# Patient Record
Sex: Male | Born: 2000 | Hispanic: No | Marital: Single | Smoking: Never smoker
Health system: Southern US, Community
[De-identification: ages and names within clinical notes are randomized; demographics above are authoritative.]

## PROBLEM LIST (undated history)

## (undated) HISTORY — PX: ABDOMINAL SURGERY: SHX537

---
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
## 2021-04-15 ENCOUNTER — Emergency Department (HOSPITAL_COMMUNITY): Payer: Self-pay

## 2021-04-15 ENCOUNTER — Encounter (HOSPITAL_COMMUNITY): Payer: Self-pay

## 2021-04-15 ENCOUNTER — Emergency Department (HOSPITAL_COMMUNITY)
Admission: EM | Admit: 2021-04-15 | Discharge: 2021-04-15 | Disposition: A | Discharge status: Home / Self Care | Payer: Self-pay | Attending: Emergency Medicine | Admitting: Emergency Medicine

## 2021-04-15 DIAGNOSIS — K2900 Acute gastritis without bleeding: Secondary | ICD-10-CM | POA: Insufficient documentation

## 2021-04-15 LAB — CBC WITH DIFFERENTIAL/PLATELET
Abs Immature Granulocytes: 0.03 10*3/uL (ref 0.00–0.07)
Basophils Absolute: 0 10*3/uL (ref 0.0–0.1)
Basophils Relative: 0 %
Eosinophils Absolute: 0.1 10*3/uL (ref 0.0–0.5)
Eosinophils Relative: 1 %
HCT: 51.7 % (ref 39.0–52.0)
Hemoglobin: 17.5 g/dL — ABNORMAL HIGH (ref 13.0–17.0)
Immature Granulocytes: 0 %
Lymphocytes Relative: 26 %
Lymphs Abs: 2.3 10*3/uL (ref 0.7–4.0)
MCH: 29 pg (ref 26.0–34.0)
MCHC: 33.8 g/dL (ref 30.0–36.0)
MCV: 85.7 fL (ref 80.0–100.0)
Monocytes Absolute: 0.9 10*3/uL (ref 0.1–1.0)
Monocytes Relative: 10 %
Neutro Abs: 5.7 10*3/uL (ref 1.7–7.7)
Neutrophils Relative %: 63 %
Platelets: 219 10*3/uL (ref 150–400)
RBC: 6.03 MIL/uL — ABNORMAL HIGH (ref 4.22–5.81)
RDW: 13.2 % (ref 11.5–15.5)
WBC: 9.1 10*3/uL (ref 4.0–10.5)
nRBC: 0 % (ref 0.0–0.2)

## 2021-04-15 LAB — COMPREHENSIVE METABOLIC PANEL
ALT: 26 U/L (ref 0–44)
AST: 25 U/L (ref 15–41)
Albumin: 4.6 g/dL (ref 3.5–5.0)
Alkaline Phosphatase: 67 U/L (ref 38–126)
Anion gap: 13 (ref 5–15)
BUN: 13 mg/dL (ref 6–20)
CO2: 24 mmol/L (ref 22–32)
Calcium: 9.9 mg/dL (ref 8.9–10.3)
Chloride: 102 mmol/L (ref 98–111)
Creatinine, Ser: 0.91 mg/dL (ref 0.61–1.24)
GFR, Estimated: >60 mL/min (ref >60)
Glucose, Bld: 103 mg/dL — ABNORMAL HIGH (ref 70–99)
Potassium: 3.4 mmol/L — ABNORMAL LOW (ref 3.5–5.1)
Sodium: 139 mmol/L (ref 135–145)
Total Bilirubin: 0.7 mg/dL (ref 0.3–1.2)
Total Protein: 7.5 g/dL (ref 6.5–8.1)

## 2021-04-15 LAB — URINALYSIS, ROUTINE W REFLEX MICROSCOPIC
Bilirubin Urine: NEGATIVE
Glucose, UA: NEGATIVE mg/dL
Hgb urine dipstick: NEGATIVE
Ketones, ur: NEGATIVE mg/dL
Leukocytes,Ua: NEGATIVE
Nitrite: NEGATIVE
Protein, ur: 30 mg/dL — AB
Specific Gravity, Urine: 1.028 (ref 1.005–1.030)
pH: 8 (ref 5.0–8.0)

## 2021-04-15 LAB — LIPASE, BLOOD: Lipase: 39 U/L (ref 11–51)

## 2021-04-15 MED ORDER — MORPHINE SULFATE (PF) 4 MG/ML IV SOLN
4.0000 mg | Freq: Once | INTRAVENOUS | Status: AC
  Administered 2021-04-15: 4 mg via INTRAVENOUS
  Filled 2021-04-15: qty 1

## 2021-04-15 MED ORDER — ONDANSETRON 8 MG PO TBDP: 8.0000 mg | ORAL_TABLET | Freq: Three times a day (TID) | ORAL | 0 refills | Status: AC | PRN

## 2021-04-15 MED ORDER — PANTOPRAZOLE SODIUM 40 MG IV SOLR
40.0000 mg | Freq: Once | INTRAVENOUS | Status: AC
  Administered 2021-04-15: 40 mg via INTRAVENOUS
  Filled 2021-04-15: qty 40

## 2021-04-15 MED ORDER — PANTOPRAZOLE SODIUM 20 MG PO TBEC: 20.0000 mg | DELAYED_RELEASE_TABLET | Freq: Two times a day (BID) | ORAL | 0 refills | Status: AC

## 2021-04-15 MED ORDER — OXYCODONE-ACETAMINOPHEN 5-325 MG PO TABS
1.0000 | ORAL_TABLET | ORAL | Status: DC | PRN
  Administered 2021-04-15: 1 via ORAL
  Filled 2021-04-15: qty 1

## 2021-04-15 MED ORDER — ONDANSETRON HCL 4 MG/2ML IJ SOLN
4.0000 mg | Freq: Once | INTRAMUSCULAR | Status: AC
  Administered 2021-04-15: 4 mg via INTRAVENOUS
  Filled 2021-04-15: qty 2

## 2021-04-15 MED ORDER — IOHEXOL 300 MG/ML  SOLN
100.0000 mL | Freq: Once | INTRAMUSCULAR | Status: AC | PRN
  Administered 2021-04-15: 100 mL via INTRAVENOUS

## 2021-04-15 MED ORDER — SODIUM CHLORIDE 0.9 % IV SOLN
1000.0000 mL | INTRAVENOUS | Status: DC
  Administered 2021-04-15: 1000 mL via INTRAVENOUS

## 2021-04-15 MED ORDER — SODIUM CHLORIDE 0.9 % IV BOLUS (SEPSIS)
1000.0000 mL | Freq: Once | INTRAVENOUS | Status: AC
  Administered 2021-04-15: 1000 mL via INTRAVENOUS

## 2021-04-15 MED ORDER — PANTOPRAZOLE SODIUM 20 MG PO TBEC: 20.0000 mg | DELAYED_RELEASE_TABLET | Freq: Two times a day (BID) | ORAL | 0 refills | Status: DC

## 2021-04-15 MED ORDER — ONDANSETRON 8 MG PO TBDP: 8.0000 mg | ORAL_TABLET | Freq: Three times a day (TID) | ORAL | 0 refills | Status: DC | PRN

## 2021-04-15 NOTE — ED Notes (Signed)
C/o generalized abd pain. Requesting pain medication.

## 2021-04-15 NOTE — ED Triage Notes (Signed)
Pt states that he has been having epigastric pain since he woke up this morning, some nausea, denies vomiting

## 2021-04-15 NOTE — ED Notes (Signed)
Patient transported to CT 

## 2021-04-15 NOTE — ED Provider Notes (Signed)
Emergency Medicine Provider Triage Evaluation Note  Glenn Craig , a 20 y.o. male  was evaluated in triage.  Pt complains of epigastric abdominal pain. Has had similar pain in the past but usually spontaneously resolves.  Pain began in the morning, 24 hours ago.  It has remained constant and is more severe.  Describes the pain as sharp.  It has been unrelieved with Tylenol.  Has had some nausea as well as vomiting.  Has been belching frequently.  No bowel movement since onset of pain.  Drinks 3 alcoholic beverages per day.  Denies illicit drug use.  Has prior abdominal surgery in childhood, but is unsure what this was for.  Review of Systems  Positive: Abdominal pain, N/V Negative: Fever  Physical Exam  There were no vitals taken for this visit. Gen:   Awake, appears uncomfortable; mild distress Resp:  Splinting with inspiration. Lungs CTAB.  MSK:   Moves extremities without difficulty  Other:  Abdomen with mild epigastric TTP. No palpable masses or peritoneal signs.  Medical Decision Making  Medically screening exam initiated at 4:12 AM.  Appropriate orders placed.  Glenn Craig was informed that the remainder of the evaluation will be completed by another provider, this initial triage assessment does not replace that evaluation, and the importance of remaining in the ED until their evaluation is complete.  Abdominal pain   Antony Madura, PA-C 04/15/21 8250    Shon Baton, MD 04/16/21 [phone]

## 2021-04-15 NOTE — ED Notes (Signed)
Pt states pain is 10/10 and that abd pain is radiating up into chest.  States he vomited x 1.

## 2021-04-15 NOTE — Discharge Instructions (Addendum)
Take the medications as prescribed.  Follow-up with your primary care doctor to be rechecked if the symptoms do not improve.  Return as needed for fevers chills worsening symptoms

## 2021-04-15 NOTE — ED Provider Notes (Signed)
[facility] EMERGENCY DEPARTMENT Provider Note   CSN: [id number] Arrival date & time: 04/15/21  0222     History Chief Complaint  Patient presents with   Abdominal Pain    Glenn Craig is a 20 y.o. male.   Abdominal Pain  Patient presents to the ED for evaluation of upper abdominal pain.  Patient states the symptoms started a couple days ago.  He has had constant pain in his upper abdomen.  He is also had some episodes of increased belching.  He tried taking Tylenol without relief.  He has had some nausea and vomiting.  He denies any diarrhea.  No fevers.  No difficulty urinating. The pain at times gets very severe.  It has radiated up into his chest  History reviewed. No pertinent past medical history.  There are no problems to display for this patient.   Past Surgical History:  Procedure Laterality Date   ABDOMINAL SURGERY     Unsure what surgery was       No family history on file.  Social History   Tobacco Use   Smoking status: Never  Substance Use Topics   Alcohol use: No    Home Medications Prior to Admission medications   Medication Sig Start Date End Date Taking? Authorizing Provider  ondansetron (ZOFRAN ODT) 8 MG disintegrating tablet Take 1 tablet (8 mg total) by mouth every 8 (eight) hours as needed for nausea or vomiting. 04/15/21  Yes Linwood Dibbles, MD  pantoprazole (PROTONIX) 20 MG tablet Take 1 tablet (20 mg total) by mouth 2 (two) times daily for 10 days. 04/15/21 04/25/21 Yes Linwood Dibbles, MD  EPINEPHrine (EPIPEN JR 2-PAK) 0.15 MG/0.3ML injection Inject 0.3 mLs (0.15 mg total) into the muscle as needed for anaphylaxis. 11/25/16   Tegeler, Canary Brim, MD  famotidine (PEPCID) 20 MG tablet Take 1 tablet (20 mg total) by mouth 2 (two) times daily. 08/20/15   Elpidio Anis, PA-C    Allergies    Patient has no known allergies.  Review of Systems   Review of Systems  Gastrointestinal:  Positive for abdominal pain.  All other  systems reviewed and are negative.  Physical Exam Updated Vital Signs BP 122/79   Pulse 79   Temp 100 F (37.8 C) (Temporal)   Resp 18   SpO2 99%   Physical Exam Vitals and nursing note reviewed.  Constitutional:      General: He is not in acute distress.    Appearance: He is well-developed.  HENT:     Head: Normocephalic and atraumatic.     Right Ear: External ear normal.     Left Ear: External ear normal.  Eyes:     General: No scleral icterus.       Right eye: No discharge.        Left eye: No discharge.     Conjunctiva/sclera: Conjunctivae normal.  Neck:     Trachea: No tracheal deviation.  Cardiovascular:     Rate and Rhythm: Normal rate and regular rhythm.  Pulmonary:     Effort: Pulmonary effort is normal. No respiratory distress.     Breath sounds: Normal breath sounds. No stridor. No wheezing or rales.  Abdominal:     General: Bowel sounds are normal. There is no distension.     Palpations: Abdomen is soft.     Tenderness: There is abdominal tenderness in the epigastric area. There is no guarding or rebound.     Hernia: No hernia is present.  Musculoskeletal:        General: No tenderness or deformity.     Cervical back: Neck supple.  Skin:    General: Skin is warm and dry.     Findings: No rash.  Neurological:     General: No focal deficit present.     Mental Status: He is alert.     Cranial Nerves: No cranial nerve deficit (no facial droop, extraocular movements intact, no slurred speech).     Sensory: No sensory deficit.     Motor: No abnormal muscle tone or seizure activity.     Coordination: Coordination normal.  Psychiatric:        Mood and Affect: Mood normal.    ED Results / Procedures / Treatments   Labs (all labs ordered are listed, but only abnormal results are displayed) Labs Reviewed  CBC WITH DIFFERENTIAL/PLATELET - Abnormal; Notable for the following components:      Result Value   RBC 6.03 (*)    Hemoglobin 17.5 (*)    All other  components within normal limits  COMPREHENSIVE METABOLIC PANEL - Abnormal; Notable for the following components:   Potassium 3.4 (*)    Glucose, Bld 103 (*)    All other components within normal limits  URINALYSIS, ROUTINE W REFLEX MICROSCOPIC - Abnormal; Notable for the following components:   APPearance HAZY (*)    Protein, ur 30 (*)    Bacteria, UA FEW (*)    All other components within normal limits  LIPASE, BLOOD    EKG EKG Interpretation  Date/Time:  Sunday April 15 2021 09:39:31 EDT Ventricular Rate:  82 PR Interval:  120 QRS Duration: 104 QT Interval:  352 QTC Calculation: 411 R Axis:   45 Text Interpretation: Normal sinus rhythm Indeterminate axis Incomplete right bundle branch block Borderline ECG 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 some change in repolarization abnormality but no STEMI Confirmed by Arby Barrette [phone]) on 04/15/2021 9:55:36 AM  Radiology DG Chest 1 View  Result Date: 04/15/2021 CLINICAL DATA:  Epigastric pain. EXAM: CHEST  1 VIEW COMPARISON:  August 20, 2015 FINDINGS: The heart size and mediastinal contours are within normal limits. Both lungs are clear. The visualized skeletal structures are unremarkable. IMPRESSION: No active disease. Electronically Signed   By: Gerome Sam III M.D.   On: 04/15/2021 05:21   CT ABDOMEN PELVIS W CONTRAST  Result Date: 04/15/2021 CLINICAL DATA:  Generalized abdominal pain most prominent over the epigastric region as pain began 24 hours ago. EXAM: CT ABDOMEN AND PELVIS WITH CONTRAST TECHNIQUE: Multidetector CT imaging of the abdomen and pelvis was performed using the standard protocol following bolus administration of intravenous contrast. CONTRAST:  OMNIPAQUE IOHEXOL 300 MG/ML  SOLN COMPARISON:  None. FINDINGS: Lower chest: Lung bases are clear. Hepatobiliary: Liver, gallbladder and biliary tree are normal. Pancreas: Normal. Spleen: Normal. Adrenals/Urinary Tract: Adrenal glands are normal. Kidneys are normal in size  without hydronephrosis or nephrolithiasis. Ureters and bladder are normal. Stomach/Bowel: Stomach and small bowel are normal. Mild diverticulosis throughout the colon. Appendix not visualized. Vascular/Lymphatic: Abdominal aorta is normal caliber. Slight increased number of small lymph nodes over the mesentery of the right lower quadrant. Reproductive: Normal. Other: No free fluid or focal inflammatory change. Musculoskeletal: No focal abnormality. IMPRESSION: 1. No definite acute findings in the abdomen/pelvis. There is slight increased number of mesenteric lymph nodes over the right lower quadrant which can be seen in mesenteric adenitis. 2. Mild colonic diverticulosis. Electronically Signed   By: Elberta Fortis M.D.  On: 04/15/2021 13:26    Procedures Procedures   Medications Ordered in ED Medications  oxyCODONE-acetaminophen (PERCOCET/ROXICET) 5-325 MG per tablet 1 tablet (1 tablet Oral Given 04/15/21 0816)  sodium chloride 0.9 % bolus 1,000 mL (1,000 mLs Intravenous New Bag/Given 04/15/21 1334)    Followed by  0.9 %  sodium chloride infusion (1,000 mLs Intravenous New Bag/Given 04/15/21 1335)  ondansetron (ZOFRAN) injection 4 mg (4 mg Intravenous Given 04/15/21 1230)  pantoprazole (PROTONIX) injection 40 mg (40 mg Intravenous Given 04/15/21 1230)  morphine 4 MG/ML injection 4 mg (4 mg Intravenous Given 04/15/21 1231)  iohexol (OMNIPAQUE) 300 MG/ML solution 100 mL (100 mLs Intravenous Contrast Given 04/15/21 1254)    ED Course  I have reviewed the triage vital signs and the nursing notes.  Pertinent labs & imaging results that were available during my care of the patient were reviewed by me and considered in my medical decision making (see chart for details).  Clinical Course as of 04/15/21 1344  Sun Apr 15, 2021  1303 Labs reviewed, CBC metabolic panel unremarkable.  Urinalysis and lipase unremarkable [JK]  1328 CT scan does not show any definite acute findings.  Slight increase number of  mesenteric lymph nodes.  Mild colonic diverticulosis but no diverticulitis [JK]    Clinical Course User Index [JK] Linwood Dibbles, MD   MDM Rules/Calculators/A&P                           Patient presented with abdominal pain.  Primarily in the upper abdomen.  Symptoms concerning for the possibility of pancreatitis gastritis hepatitis.  No tenderness in the right upper quadrant.  Doubt cholecystitis.  ED work-up showed normal laboratory test.  CT scan was performed.  No evidence of obstruction appendicitis diverticulitis or colitis.  Some findings to suggest the possibility of mesenteric adenitis but presentation more suggestive of gastritis.  Will discharge home with course of antacids.  Outpatient follow-up with primary care doctor. Final Clinical Impression(s) / ED Diagnoses Final diagnoses:  Acute gastritis without hemorrhage, unspecified gastritis type    Rx / DC Orders ED Discharge Orders          Ordered    pantoprazole (PROTONIX) 20 MG tablet  2 times daily        04/15/21 1342    ondansetron (ZOFRAN ODT) 8 MG disintegrating tablet  Every 8 hours PRN        04/15/21 1342             Linwood Dibbles, MD 04/15/21 1344

## 2022-12-16 IMAGING — DX DG CHEST 1V
1 series · 1 of 1 positions shown · non-contrast
Comparison: August 20, 2015

CLINICAL DATA: Epigastric pain.

EXAM:
CHEST  1 VIEW

[chest pa]
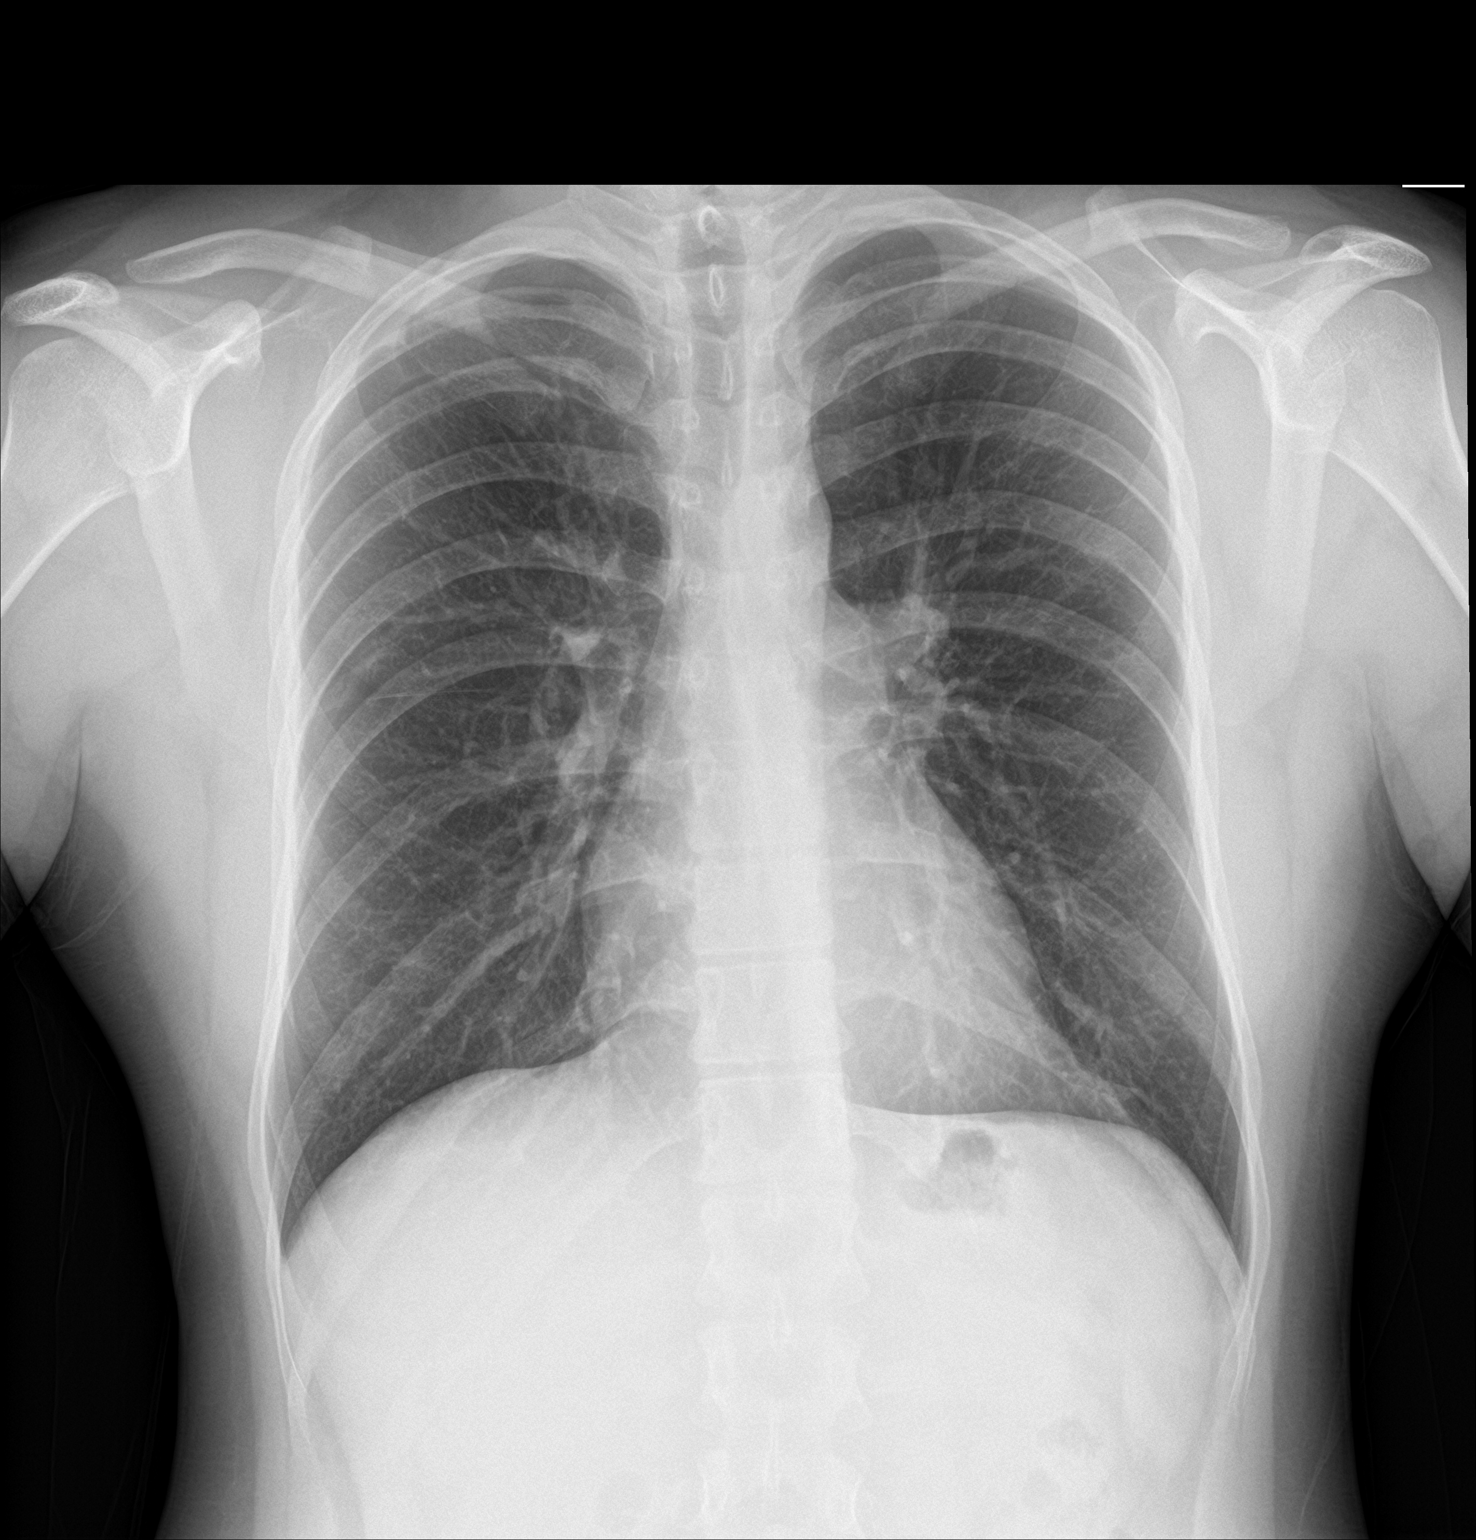

[1 of 1 positions shown; findings below may reference images not displayed]

FINDINGS: The heart size and mediastinal contours are within normal limits.
Both lungs are clear. The visualized skeletal structures are
unremarkable.
IMPRESSION: No active disease.

## 2023-03-24 ENCOUNTER — Emergency Department (HOSPITAL_COMMUNITY)
Admission: EM | Admit: 2023-03-24 | Discharge: 2023-03-24 | Disposition: A | Discharge status: Home / Self Care | Payer: Self-pay | Attending: Emergency Medicine | Admitting: Emergency Medicine

## 2023-03-24 ENCOUNTER — Encounter (HOSPITAL_COMMUNITY): Payer: Self-pay | Admitting: Emergency Medicine

## 2023-03-24 DIAGNOSIS — Z1152 Encounter for screening for COVID-19: Secondary | ICD-10-CM | POA: Insufficient documentation

## 2023-03-24 DIAGNOSIS — J069 Acute upper respiratory infection, unspecified: Secondary | ICD-10-CM | POA: Insufficient documentation

## 2023-03-24 DIAGNOSIS — J029 Acute pharyngitis, unspecified: Secondary | ICD-10-CM | POA: Insufficient documentation

## 2023-03-24 LAB — GROUP A STREP BY PCR: Group A Strep by PCR: NOT DETECTED

## 2023-03-24 LAB — SARS CORONAVIRUS 2 BY RT PCR: SARS Coronavirus 2 by RT PCR: NEGATIVE

## 2023-03-24 MED ORDER — CLINDAMYCIN HCL 300 MG PO CAPS: 300.0000 mg | ORAL_CAPSULE | Freq: Three times a day (TID) | ORAL | 0 refills | Status: AC

## 2023-03-24 MED ORDER — DEXAMETHASONE 4 MG PO TABS
10.0000 mg | ORAL_TABLET | Freq: Once | ORAL | Status: AC
  Administered 2023-03-24: 10 mg via ORAL
  Filled 2023-03-24: qty 3

## 2023-03-24 NOTE — Discharge Instructions (Signed)
Take antibiotic as prescribed.  You have been treated with a long-acting steroid which should help with your symptoms.  Return if symptoms worsen.  Follow-up with primary care doctor.

## 2023-03-24 NOTE — ED Triage Notes (Signed)
Pt states nasal congestion for a few days. Throat is red and swollen. He says that started when he woke up. Eyes look swollen and red. He endorses "feeling like stuff coming down back of throat". Denies fevers

## 2023-03-24 NOTE — ED Provider Notes (Signed)
[place] EMERGENCY DEPARTMENT AT [facility] Provider Note   CSN: [id number] Arrival date & time: 03/24/23  0556     History  Chief Complaint  Patient presents with   URI    Glenn Craig is a 22 y.o. male.  Patient here with sore throat, nasal congestion for the last few days.  Nothing makes it worse or better.  No significant medical history.  No difficulty eating or drinking.  Denies any chest pain shortness of breath cough or sputum production.  No numbness weakness tingling.  No headaches.  The history is provided by the patient.       Home Medications Prior to Admission medications   Medication Sig Start Date End Date Taking? Authorizing Provider  clindamycin (CLEOCIN) 300 MG capsule Take 1 capsule (300 mg total) by mouth 3 (three) times daily for 10 days. 03/24/23 04/03/23 Yes Aiyanah Kalama, DO  EPINEPHrine (EPIPEN JR 2-PAK) 0.15 MG/0.3ML injection Inject 0.3 mLs (0.15 mg total) into the muscle as needed for anaphylaxis. 11/25/16   Tegeler, Canary Brim, MD  famotidine (PEPCID) 20 MG tablet Take 1 tablet (20 mg total) by mouth 2 (two) times daily. 08/20/15   Elpidio Anis, PA-C  ondansetron (ZOFRAN ODT) 8 MG disintegrating tablet Take 1 tablet (8 mg total) by mouth every 8 (eight) hours as needed for nausea or vomiting. 04/15/21   Linwood Dibbles, MD  pantoprazole (PROTONIX) 20 MG tablet Take 1 tablet (20 mg total) by mouth 2 (two) times daily for 10 days. 04/15/21 04/25/21  Linwood Dibbles, MD      Allergies    Patient has no known allergies.    Review of Systems   Review of Systems  Physical Exam Updated Vital Signs BP (!) 149/90 (BP Location: Right Arm)   Pulse 100   Temp 98 F (36.7 C)   Resp 17   SpO2 100%  Physical Exam Vitals and nursing note reviewed.  Constitutional:      General: He is not in acute distress.    Appearance: He is well-developed. He is not ill-appearing.  HENT:     Head: Normocephalic and atraumatic.     Nose: Congestion  present.     Mouth/Throat:     Mouth: Mucous membranes are moist.     Pharynx: Posterior oropharyngeal erythema present. No oropharyngeal exudate.  Eyes:     Extraocular Movements: Extraocular movements intact.     Conjunctiva/sclera: Conjunctivae normal.     Pupils: Pupils are equal, round, and reactive to light.  Cardiovascular:     Rate and Rhythm: Normal rate and regular rhythm.     Pulses: Normal pulses.     Heart sounds: Normal heart sounds. No murmur heard. Pulmonary:     Effort: Pulmonary effort is normal. No respiratory distress.     Breath sounds: Normal breath sounds.  Abdominal:     Palpations: Abdomen is soft.     Tenderness: There is no abdominal tenderness.  Musculoskeletal:        General: No swelling.     Cervical back: Normal range of motion and neck supple.  Skin:    General: Skin is warm and dry.     Capillary Refill: Capillary refill takes less than 2 seconds.  Neurological:     Mental Status: He is alert.  Psychiatric:        Mood and Affect: Mood normal.     ED Results / Procedures / Treatments   Labs (all labs ordered are listed, but only  abnormal results are displayed) Labs Reviewed  GROUP A STREP BY PCR  SARS CORONAVIRUS 2 BY RT PCR    EKG EKG Interpretation Date/Time:  Monday March 24 2023 06:04:04 EDT Ventricular Rate:  98 PR Interval:  136 QRS Duration:  104 QT Interval:  338 QTC Calculation: 431 R Axis:   136  Text Interpretation: Normal sinus rhythm Right axis deviation Pulmonary disease pattern Incomplete right bundle branch block Possible Right ventricular hypertrophy Abnormal ECG When compared with ECG of 24-Mar-2023 06:03, PREVIOUS ECG IS PRESENT Confirmed by Virgina Norfolk [phone]) on 03/24/2023 7:50:10 AM  Radiology No results found.  Procedures Procedures    Medications Ordered in ED Medications  dexamethasone (DECADRON) tablet 10 mg (has no administration in time range)    ED Course/ Medical Decision Making/ A&P                                  Medical Decision Making Risk Prescription drug management.   Glenn Craig is here with sore throat.  No significant medical history.  Nothing was worse or better.  Overall differential diagnosis likely viral process versus pharyngitis versus strep.  COVID flu and strep test all negative per my review and interpretation.  He does have some erythema in the back of his throat I will treat him with antibiotics for possible atypical throat infection.  Will give a dose of Decadron.  Overall I have no concern for other major infectious process.  He has no signs of abscess on throat exam.  He has no trismus or drooling.  He understands return precautions.  Vital signs are unremarkable.  This chart was dictated using voice recognition software.  Despite best efforts to proofread,  errors can occur which can change the documentation meaning.        Final Clinical Impression(s) / ED Diagnoses Final diagnoses:  Upper respiratory tract infection, unspecified type  Pharyngitis, unspecified etiology    Rx / DC Orders ED Discharge Orders          Ordered    clindamycin (CLEOCIN) 300 MG capsule  3 times daily        03/24/23 0813              Virgina Norfolk, DO 03/24/23 [phone]

## 2023-03-24 NOTE — ED Notes (Signed)
C/o cold sx for at least a week-- eyes puffy, nasal congestion, has been taking OTC meds without relief.
